# Patient Record
Sex: Male | Born: 1995 | Smoking: Never smoker
Health system: Southern US, Community
[De-identification: ages and names within clinical notes are randomized; demographics above are authoritative.]

---

## 2018-06-22 ENCOUNTER — Other Ambulatory Visit: Payer: Self-pay

## 2018-06-22 ENCOUNTER — Telehealth: Payer: Self-pay | Admitting: *Deleted

## 2018-06-22 DIAGNOSIS — Z20822 Contact with and (suspected) exposure to covid-19: Secondary | ICD-10-CM

## 2018-06-22 NOTE — Telephone Encounter (Signed)
Remi Haggard, RN, MSN from [facility] Department called requesting that the pt be contacted to be scheduled for COVID testing per Dr Valere Dross; he can be contacted at [phone]; will attempt to contact pt.

## 2018-06-22 NOTE — Telephone Encounter (Signed)
Contacted pt to schedule testing; pt offered and accepted appointment at [facility] site 06/22/2018 at 1330; pt given address, directions, and instructions that he and all occupants of his vehicle should wear a mask; e verbalized understanding; orders placed per protocol

## 2018-06-24 LAB — NOVEL CORONAVIRUS, NAA: SARS-CoV-2, NAA: NOT DETECTED

## 2021-03-26 ENCOUNTER — Emergency Department: Payer: Worker's Compensation

## 2021-03-26 ENCOUNTER — Other Ambulatory Visit: Payer: Self-pay

## 2021-03-26 ENCOUNTER — Emergency Department
Admission: EM | Admit: 2021-03-26 | Discharge: 2021-03-26 | Disposition: A | Discharge status: Home / Self Care | Payer: Worker's Compensation | Attending: Emergency Medicine | Admitting: Emergency Medicine

## 2021-03-26 DIAGNOSIS — X501XXA Overexertion from prolonged static or awkward postures, initial encounter: Secondary | ICD-10-CM | POA: Insufficient documentation

## 2021-03-26 DIAGNOSIS — S6992XA Unspecified injury of left wrist, hand and finger(s), initial encounter: Secondary | ICD-10-CM | POA: Diagnosis present

## 2021-03-26 DIAGNOSIS — Y990 Civilian activity done for income or pay: Secondary | ICD-10-CM | POA: Diagnosis not present

## 2021-03-26 DIAGNOSIS — S62355A Nondisplaced fracture of shaft of fourth metacarpal bone, left hand, initial encounter for closed fracture: Secondary | ICD-10-CM | POA: Diagnosis not present

## 2021-03-26 DIAGNOSIS — S62353A Nondisplaced fracture of shaft of third metacarpal bone, left hand, initial encounter for closed fracture: Secondary | ICD-10-CM

## 2021-03-26 MED ORDER — OXYCODONE-ACETAMINOPHEN 5-325 MG PO TABS: 1.0000 | ORAL_TABLET | Freq: Four times a day (QID) | ORAL | 0 refills | Status: DC | PRN

## 2021-03-26 MED ORDER — OXYCODONE-ACETAMINOPHEN 5-325 MG PO TABS: 1.0000 | ORAL_TABLET | Freq: Four times a day (QID) | ORAL | 0 refills | Status: AC | PRN

## 2021-03-26 MED ORDER — OXYCODONE-ACETAMINOPHEN 5-325 MG PO TABS
1.0000 | ORAL_TABLET | Freq: Once | ORAL | Status: AC
  Administered 2021-03-26: 1 via ORAL
  Filled 2021-03-26: qty 1

## 2021-03-26 NOTE — ED Provider Notes (Signed)
? ?[facility] ?Provider Note ? ?Patient Contact: 9:46 PM (approximate) ? ? ?History  ? ?Hand Injury ? ? ?HPI ? ?Ricardo Bishop is a 26 y.o. male who presents the emergency department complaining of pain to the left hand and wrist after an injury at work.  Patient was using a concrete drill when it balance and caused the patient's hand to twist.  In addition there was 2 wooden support beams that the patient's hand struck during this twisting action.  Patient has bruising, edema along the dorsal aspect of the hand.  He has pain radiating from the wrist into the hand at this time.  He is able to extend and flex the fingers though doing so increases his pain.  No loss of sensation.  No open wounds reported.  No other complaints at this time. ?  ? ? ?Physical Exam  ? ?Triage Vital Signs: ?ED Triage Vitals  ?Enc Vitals Group  ?   BP 03/26/21 2138 (!) 144/86  ?   Pulse Rate 03/26/21 2138 97  ?   Resp 03/26/21 2138 17  ?   Temp 03/26/21 2138 99.6 ?F (37.6 ?C)  ?   Temp Source 03/26/21 2138 Oral  ?   SpO2 03/26/21 2138 96 %  ?   Weight 03/26/21 2139 170 lb (77.1 kg)  ?   Height 03/26/21 2139 5\' 7"  (1.702 m)  ?   Head Circumference --   ?   Peak Flow --   ?   Pain Score 03/26/21 2138 10  ?   Pain Loc --   ?   Pain Edu? --   ?   Excl. in GC? --   ? ? ?Most recent vital signs: ?Vitals:  ? 03/26/21 2138  ?BP: (!) 144/86  ?Pulse: 97  ?Resp: 17  ?Temp: 99.6 ?F (37.6 ?C)  ?SpO2: 96%  ? ? ? ?General: Alert and in no acute distress.  ?Cardiovascular:  Good peripheral perfusion ?Respiratory: Normal respiratory effort without tachypnea or retractions. Lungs CTAB.  ?Musculoskeletal: Full range of motion to all extremities.  Visualization of the left hand reveals edema, mild ecchymosis but no open wounds.  Patient is able to extend and flex the fingers but doing so is limited somewhat by pain.  Patient has sensation and capillary refill intact.  Palpation reveals edema but no palpable deformity.  Some tenderness  extends into the dorsal wrist but does not extend into the forearm itself. ?Neurologic:  No gross focal neurologic deficits are appreciated.  ?Skin:   No rash noted ?Other: ? ? ?ED Results / Procedures / Treatments  ? ?Labs ?(all labs ordered are listed, but only abnormal results are displayed) ?Labs Reviewed - No data to display ? ? ?EKG ? ? ? ? ?RADIOLOGY ? ?I personally viewed and evaluated these images as part of my medical decision making, as well as reviewing the written report by the radiologist. ? ?ED Provider Interpretation: Patient with fractures of the third and fourth metacarpal bones without significant displacement. ? ?DG Wrist Complete Left ? ?Result Date: 03/26/2021 ?CLINICAL DATA:  Pain and swelling after injury tonight. EXAM: LEFT WRIST - COMPLETE 3+ VIEW; LEFT HAND - COMPLETE 3+ VIEW COMPARISON:  None. FINDINGS: Four views of the left wrist and three views of the left hand are obtained. Comminuted spiral oblique fractures are demonstrated involving the proximal and midshaft of the left third and fourth metacarpal bones. Mild over riding of fracture fragments without significant angulation. No definite articular surface involvement.  Dorsal soft tissue swelling over the hand. Carpal bones appear intact. IMPRESSION: Acute spiral oblique fractures of the third and fourth metacarpal bones with overriding of fracture fragments. Electronically Signed   By: Burman Nieves M.D.   On: 03/26/2021 22:20  ? ?DG Hand Complete Left ? ?Result Date: 03/26/2021 ?CLINICAL DATA:  Pain and swelling after injury tonight. EXAM: LEFT WRIST - COMPLETE 3+ VIEW; LEFT HAND - COMPLETE 3+ VIEW COMPARISON:  None. FINDINGS: Four views of the left wrist and three views of the left hand are obtained. Comminuted spiral oblique fractures are demonstrated involving the proximal and midshaft of the left third and fourth metacarpal bones. Mild over riding of fracture fragments without significant angulation. No definite articular  surface involvement. Dorsal soft tissue swelling over the hand. Carpal bones appear intact. IMPRESSION: Acute spiral oblique fractures of the third and fourth metacarpal bones with overriding of fracture fragments. Electronically Signed   By: Burman Nieves M.D.   On: 03/26/2021 22:20   ? ?PROCEDURES: ? ?Critical Care performed: No ? ?Procedures ? ? ?MEDICATIONS ORDERED IN ED: ?Medications  ?oxyCODONE-acetaminophen (PERCOCET/ROXICET) 5-325 MG per tablet 1 tablet (has no administration in time range)  ? ? ? ?IMPRESSION / MDM / ASSESSMENT AND PLAN / ED COURSE  ?I reviewed the triage vital signs and the nursing notes. ?             ?               ? ?Differential diagnosis includes, but is not limited to, contusion of the hand, ligament rupture, fractures of the hand ? ? ?Patient's diagnosis is consistent with closed fractures of the third and fourth metacarpal bones of the left hand.  Patient presents the emergency department after injuring his hand while at work.  He was using a drill which pounds and caused his hand to strike nearby wood supports.  Patient arrived with bruising and edema of the dorsal hand.  Imaging reveals fractures of the third and fourth metacarpal.  No overlying open wounds.  Patient will be splinted here in the emergency department.  Prescription for pain medication provided to the patient.  Follow-up with orthopedics for further management. Patient is given ED precautions to return to the ED for any worsening or new symptoms. ? ? ? ?  ? ? ?FINAL CLINICAL IMPRESSION(S) / ED DIAGNOSES  ? ?Final diagnoses:  ?Closed nondisplaced fracture of shaft of third metacarpal bone of left hand, initial encounter  ?Closed nondisplaced fracture of shaft of fourth metacarpal bone of left hand, initial encounter  ? ? ? ?Rx / DC Orders  ? ?ED Discharge Orders   ? ?      Ordered  ?  oxyCODONE-acetaminophen (PERCOCET/ROXICET) 5-325 MG tablet  Every 6 hours PRN       ? 03/26/21 2247  ? ?  ?  ? ?  ? ? ? ?Note:   This document was prepared using Dragon voice recognition software and may include unintentional dictation errors. ?  ?Racheal Patches, PA-C ?03/26/21 2250 ? ?  ?Concha Se, MD ?03/26/21 2346 ? ?

## 2021-03-26 NOTE — ED Notes (Signed)
Pt employed with [employer], [address], [place], [place] [phone] not listed in our workers comp profile; pt spoke with his supervisor Rae Halsted who st no info available at this time on filing requirements and will f/u tomorrow; pt given copy of ineligibility form for workers comp ?

## 2021-03-26 NOTE — ED Triage Notes (Signed)
Pt was at work today and was using a drill. The drill got jammed and while he was fixing it his hand got twisted by the drill and cord. Pt has pain and swelling in left hand and some in wrist.  ?

## 2021-04-20 ENCOUNTER — Ambulatory Visit: Payer: Worker's Compensation | Attending: Physician Assistant | Admitting: Occupational Therapy

## 2021-04-20 ENCOUNTER — Encounter: Payer: Self-pay | Admitting: Occupational Therapy

## 2021-04-20 DIAGNOSIS — M25642 Stiffness of left hand, not elsewhere classified: Secondary | ICD-10-CM | POA: Insufficient documentation

## 2021-04-20 NOTE — Therapy (Signed)
[place] ?[facility] ?[address] ?[place], [place], [address] ?Phone: [phone]   Fax:  [phone] ? ?Occupational Therapy Evaluation ? ?Patient Details  ?Name: Ricardo Bishop ?MRN: [id number] ?Date of Birth: 1995-11-16 ?No data recorded ? ?Encounter Date: 04/20/2021 ? ? OT End of Session - 04/20/21 1118   ? ? Visit Number 1   ? Number of Visits 2   ? Date for OT Re-Evaluation 05/11/21   ? OT Start Time 1045   ? OT Stop Time 1111   ? OT Time Calculation (min) 26 min   ? Activity Tolerance Patient tolerated treatment well   ? Behavior During Therapy [facility] for tasks assessed/performed   ? ?  ?  ? ?  ? ? ?History reviewed. No pertinent past medical history. ? ?History reviewed. No pertinent surgical history. ? ?There were no vitals filed for this visit. ? ? Subjective Assessment - 04/20/21 1115   ? ? Subjective  Had accident at work and fractured my middle 2 fingers and they sent me over here for splint.  Even my pinky in my index finger is stiff to   ? Pertinent History Reason for Visit  Ricardo Bishop is a 26 y.o. who presents today follow-up fracture third and fourth metacarpal left hand. Date of injury March 26, 2021. Patient states that he is doing better. Having no pain. Taking no medication. Denies any numbness, tingling or burning sensation.   Pt refered to OT for custom splint for fx - in anatomical position   ? Patient Stated Goals my fracture to heal that I do not need surgery and can go back to work   ? Currently in Pain? Yes   ? Pain Score 2    ? Pain Location Hand   ? Pain Orientation Left   ? Pain Descriptors / Indicators Aching;Tender   ? Pain Type Acute pain   ? Pain Onset More than a month ago   ? Pain Frequency Constant   ? ?  ?  ? ?  ? ? ? ? ? ? ? ? ? ? ? ? ? ? ? ? ? ? ? ? ? ? OT Education - 04/20/21 1118   ? ? Education Details Splint wearing and precautions   ? Person(s) Educated Patient   ? Methods Explanation;Demonstration;Verbal cues   ?  Comprehension Verbal cues required;Verbalized understanding;Returned demonstration   ? ?  ?  ? ?  ? ? ? ? ? ? OT Long Term Goals - 04/20/21 1126   ? ?  ? OT LONG TERM GOAL #1  ? Title Patient verbalize and demonstrate donning and doffing and wearing of clamshell ulnar gutter splint to left hand.   ? Baseline Patient was demonstrating in session donning and doffing correctly as well as adjusting straps verbalized understanding when to contact me if needed   ? Time 3   ? Period Weeks   ? Status New   ? Target Date 05/11/21   ? ?  ?  ? ?  ? ? ? ? ? ? ? ? Plan - 04/20/21 1120   ? ? Clinical Impression Statement Patient presents today with  fracture third and fourth metacarpal left hand. Date of injury March 26, 2021. Patient is 3-1/2 weeks post fracture- patient referred by orthopedics for custom hand splint in anatomical position for stabilizing fractures.  Patient shows increase edema in left hand and report not able to use second and fifth in prefab  splint applied yesterday with [employer].  Patient ed on using contrast hot and cold packs over splint.  Fabricated at this time hand base clamshell ulnar gutter splint including third through fifth digits.  Patient was placed in anatomical position of 35 to 50 degrees of flexion at metacarpals.  Patient verbalized and demonstrate donning and doffing of splint.  Patient to follow orthopedics instructions and about splint wearing. Patient verbalized understanding. .  Patient to follow-up as needed for splint adjustments or if referred back by Ortho for range of motion.   ? OT Occupational Profile and History Problem Focused Assessment - Including review of records relating to presenting problem   ? Occupational performance deficits (Please refer to evaluation for details): ADL's;IADL's;Work;Play;Leisure   ? Body Structure / Function / Physical Skills ADL;Strength;Pain;Edema;IADL;Flexibility;FMC;Decreased knowledge of precautions   ? Rehab Potential Good   ? Clinical Decision  Making Limited treatment options, no task modification necessary   ? Comorbidities Affecting Occupational Performance: None   ? Modification or Assistance to Complete Evaluation  No modification of tasks or assist necessary to complete eval   ? OT Frequency --   1-2 visits as needed if splint issues  ? OT Duration --   3 wks  ? Consulted and Agree with Plan of Care Patient   ? ?  ?  ? ?  ? ? ?Patient will benefit from skilled therapeutic intervention in order to improve the following deficits and impairments:   ?Body Structure / Function / Physical Skills: ADL, Strength, Pain, Edema, IADL, Flexibility, FMC, Decreased knowledge of precautions ?  ?  ? ? ?Visit Diagnosis: ?Stiffness of left hand, not elsewhere classified - Plan: Ot plan of care cert/re-cert ? ? ? ?Problem List ?There are no problems to display for this patient. ? ? ?Ricardo Bishop, OTR/L,CLT ?04/20/2021, 11:27 AM ? ?[place] ?[facility] ?[address] ?[place], [place], [address] ?Phone: [phone]   Fax:  [phone] ? ?Name: Ricardo Bishop ?MRN: [id number] ?Date of Birth: 14-Dec-1995 ? ?

## 2023-06-27 IMAGING — DX DG HAND COMPLETE 3+V*L*
3 series · 3 of 3 positions shown · non-contrast
Comparison: None.

CLINICAL DATA: Pain and swelling after injury tonight.

EXAM:
LEFT WRIST - COMPLETE 3+ VIEW; LEFT HAND - COMPLETE 3+ VIEW

[hand ap]
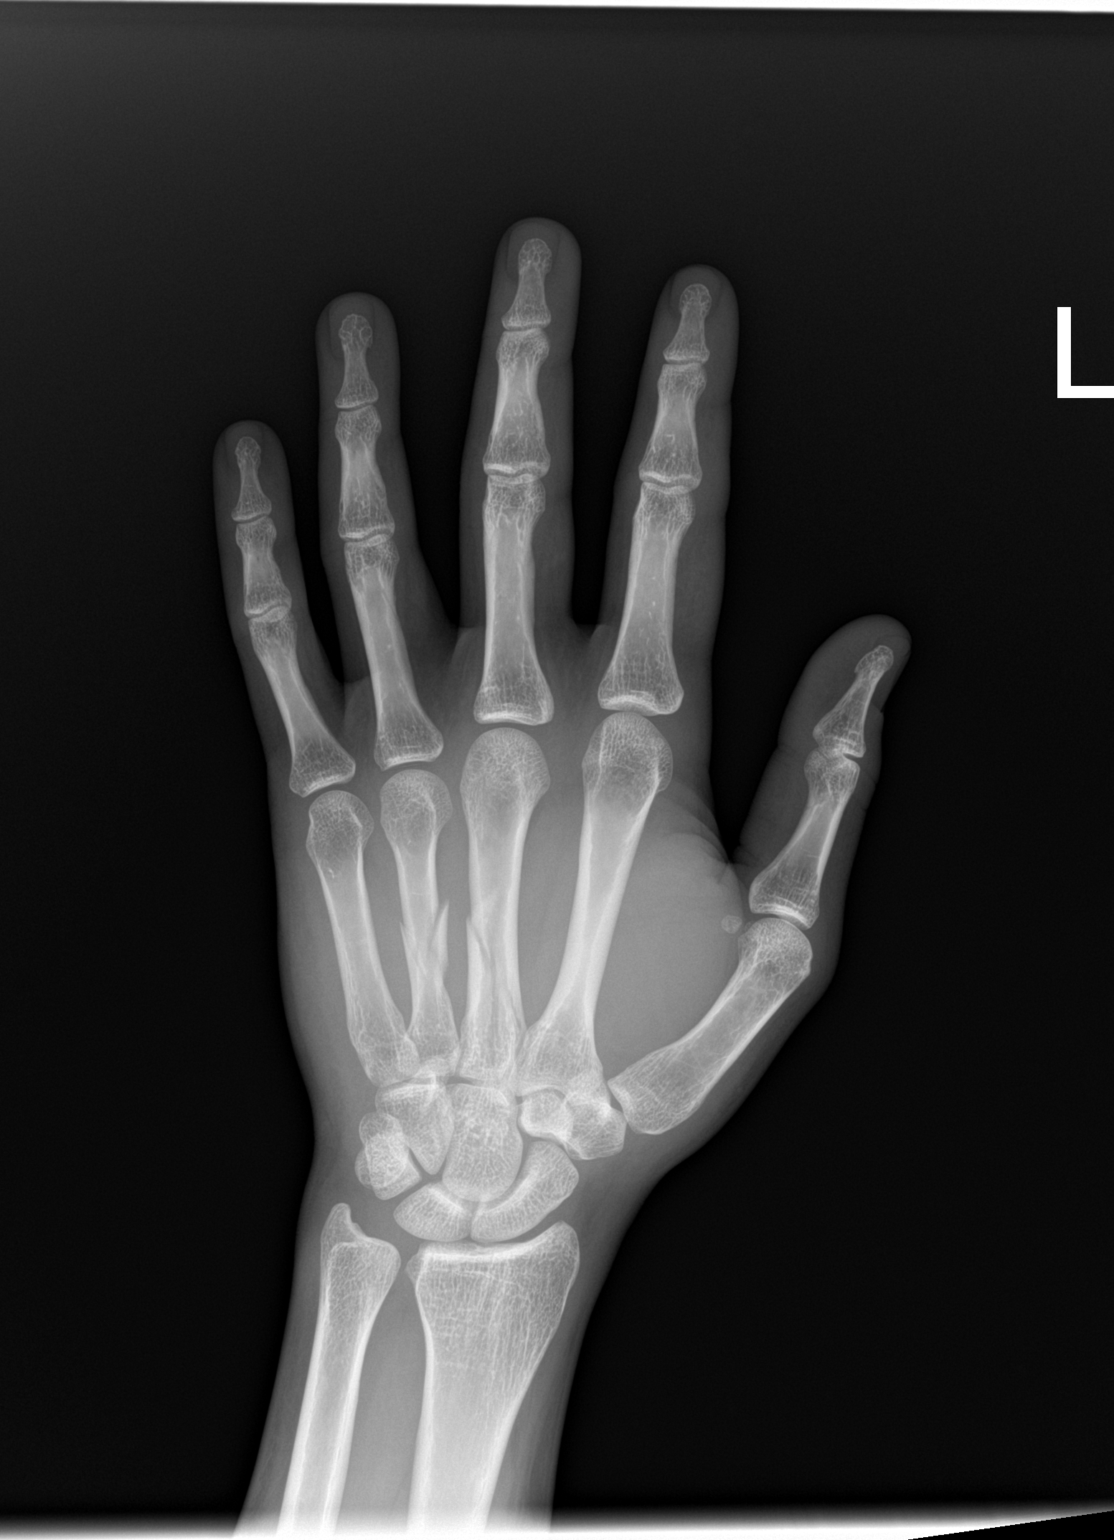

[hand obl]
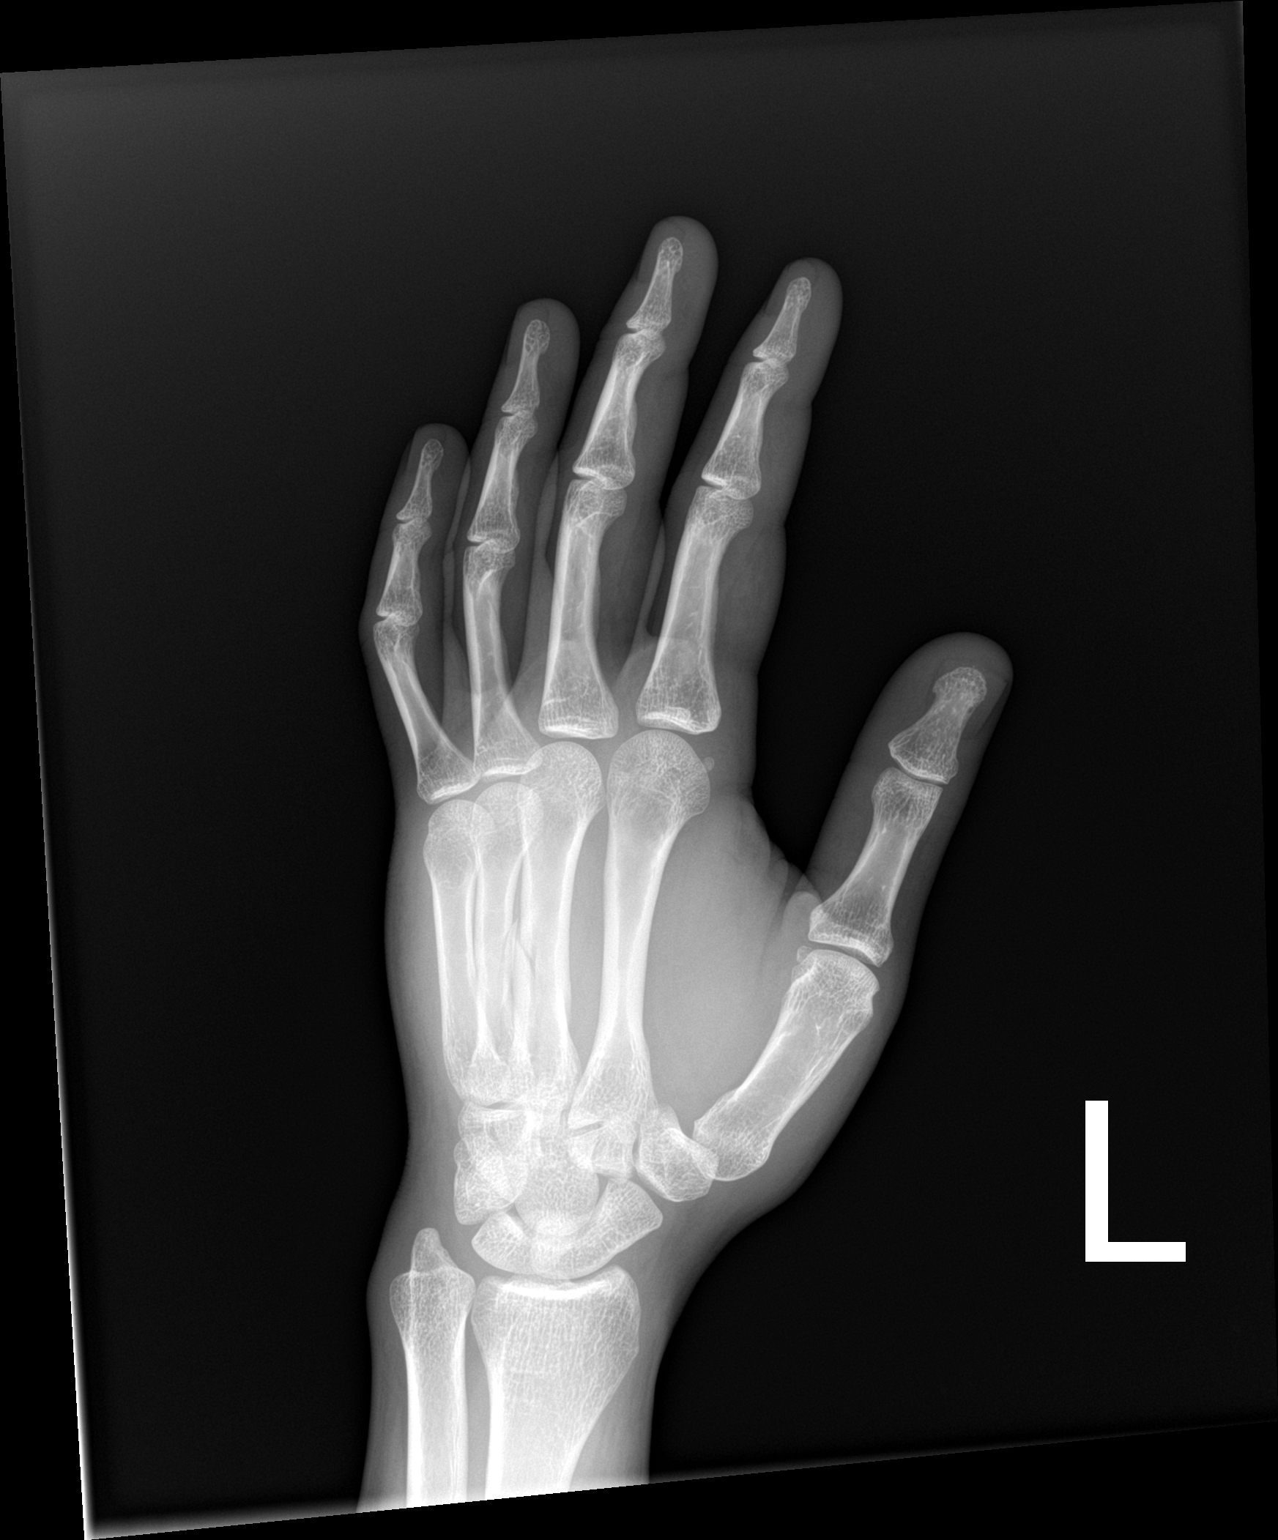

[hand lat]
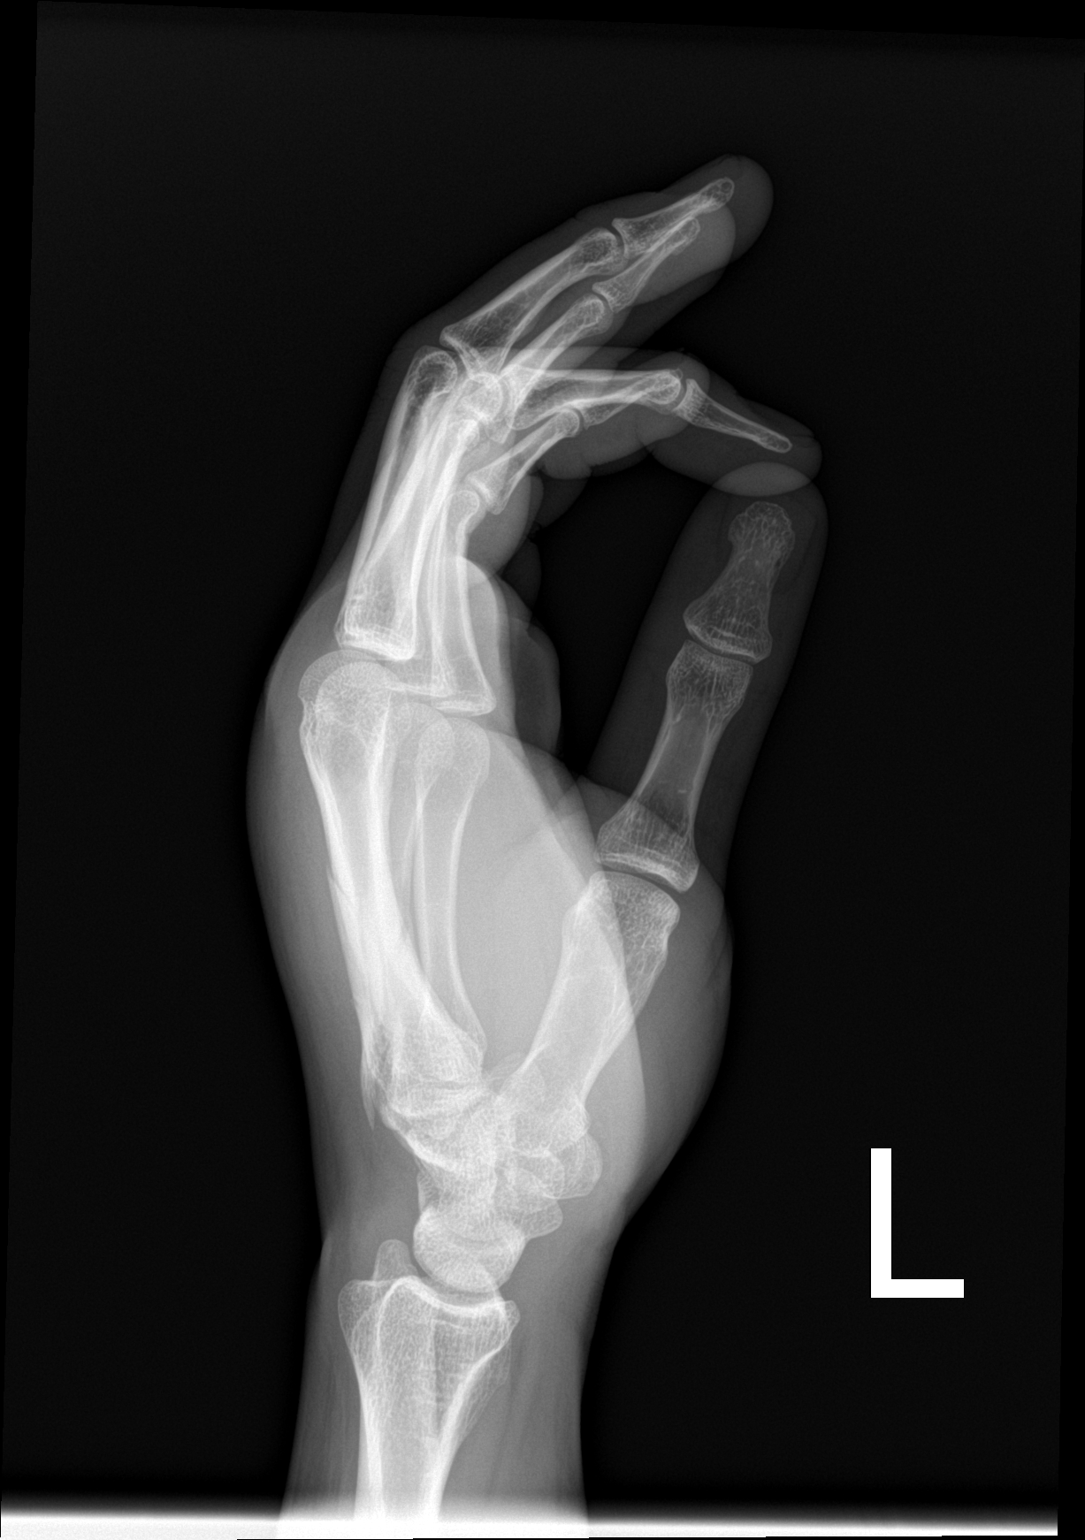

[3 of 3 positions shown; findings below may reference images not displayed]

FINDINGS: Four views of the left wrist and three views of the left hand are
obtained.

Comminuted spiral oblique fractures are demonstrated involving the
proximal and midshaft of the left third and fourth metacarpal bones.
Mild over riding of fracture fragments without significant
angulation. No definite articular surface involvement. Dorsal soft
tissue swelling over the hand. Carpal bones appear intact.
IMPRESSION: Acute spiral oblique fractures of the third and fourth metacarpal
bones with overriding of fracture fragments.
